# Patient Record
Sex: Female | Born: 1970 | Race: Black or African American | Hispanic: No | Marital: Married | Smoking: Former smoker
Health system: Southern US, Community
[De-identification: ages and names within clinical notes are randomized; demographics above are authoritative.]

## PROBLEM LIST (undated history)

## (undated) DIAGNOSIS — I10 Essential (primary) hypertension: Secondary | ICD-10-CM

## (undated) DIAGNOSIS — O24419 Gestational diabetes mellitus in pregnancy, unspecified control: Secondary | ICD-10-CM

## (undated) HISTORY — PX: NO PAST SURGERIES: SHX2092

---

## 2005-01-17 ENCOUNTER — Inpatient Hospital Stay (HOSPITAL_COMMUNITY): Admission: AD | Admit: 2005-01-17 | Discharge: 2005-01-17 | Payer: Self-pay | Admitting: Obstetrics and Gynecology

## 2005-01-17 ENCOUNTER — Ambulatory Visit: Payer: Self-pay | Admitting: Obstetrics and Gynecology

## 2005-01-19 ENCOUNTER — Ambulatory Visit: Payer: Self-pay | Admitting: *Deleted

## 2005-01-19 ENCOUNTER — Ambulatory Visit (HOSPITAL_COMMUNITY): Admission: RE | Admit: 2005-01-19 | Discharge: 2005-01-19 | Payer: Self-pay | Admitting: Obstetrics and Gynecology

## 2005-02-02 ENCOUNTER — Ambulatory Visit: Payer: Self-pay | Admitting: *Deleted

## 2005-02-08 ENCOUNTER — Inpatient Hospital Stay (HOSPITAL_COMMUNITY): Admission: AD | Admit: 2005-02-08 | Discharge: 2005-02-09 | Payer: Self-pay | Admitting: *Deleted

## 2005-02-11 ENCOUNTER — Ambulatory Visit: Payer: Self-pay | Admitting: Family Medicine

## 2005-02-11 ENCOUNTER — Inpatient Hospital Stay (HOSPITAL_COMMUNITY): Admission: AD | Admit: 2005-02-11 | Discharge: 2005-02-17 | Payer: Self-pay | Admitting: *Deleted

## 2005-06-04 ENCOUNTER — Emergency Department (HOSPITAL_COMMUNITY): Admission: EM | Admit: 2005-06-04 | Discharge: 2005-06-04 | Payer: Self-pay | Admitting: Emergency Medicine

## 2005-08-29 ENCOUNTER — Emergency Department (HOSPITAL_COMMUNITY): Admission: EM | Admit: 2005-08-29 | Discharge: 2005-08-29 | Payer: Self-pay | Admitting: Emergency Medicine

## 2007-01-18 IMAGING — US US OB FOLLOW-UP
1 series · 13 of 28 positions shown · non-contrast
Comparison: none

CLINICAL DATA: 33 weeks pregnant.  Evaluate AFI, cervical length and presentation.

[Series 1: us ob follow-up · 0.28mm/px · 13 of 31 slices shown]
[im 2/31]
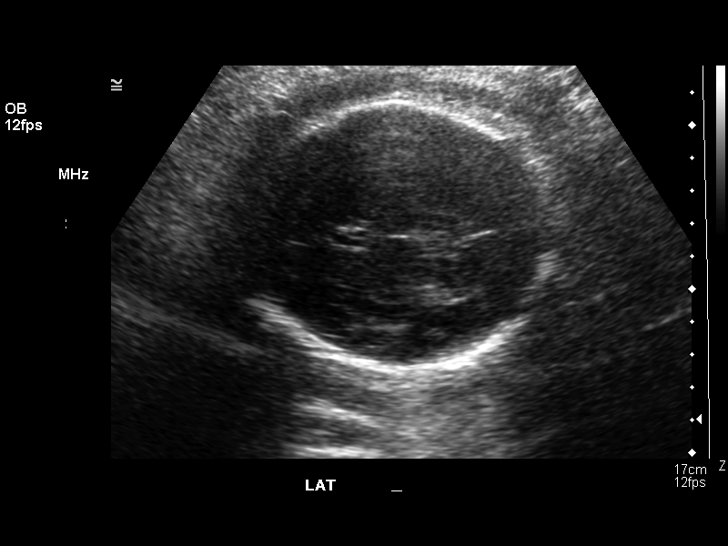
[im 4/31]
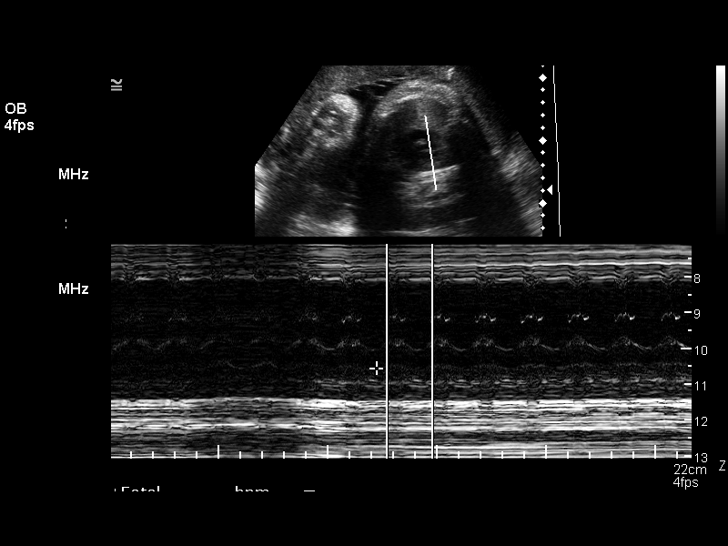
[im 6/31]
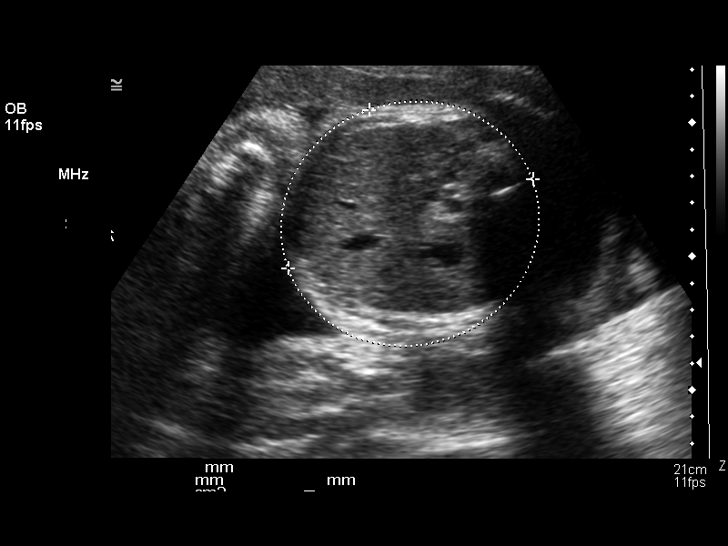
[im 8/31]
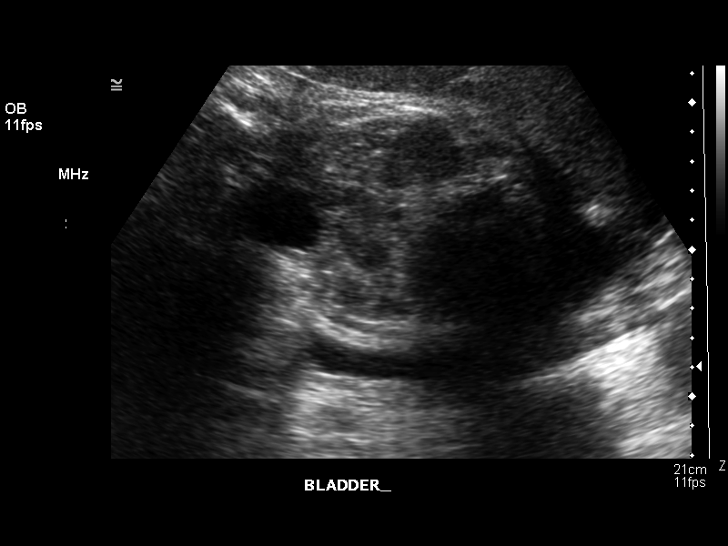
[im 11/31]
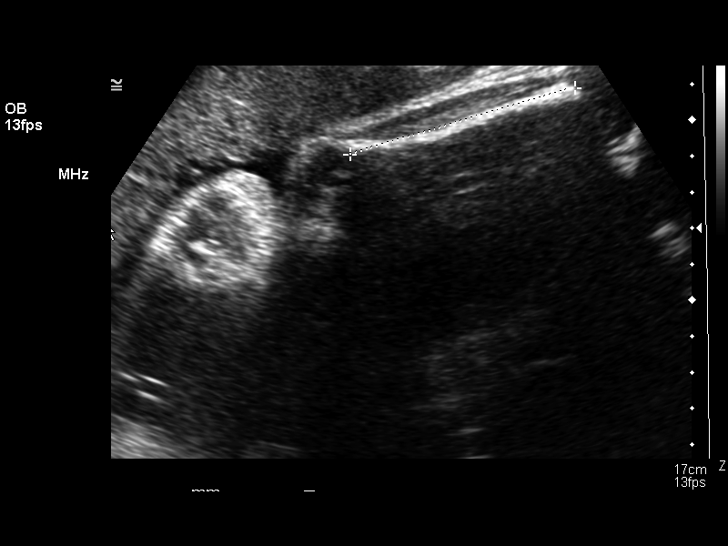
[im 13/31]
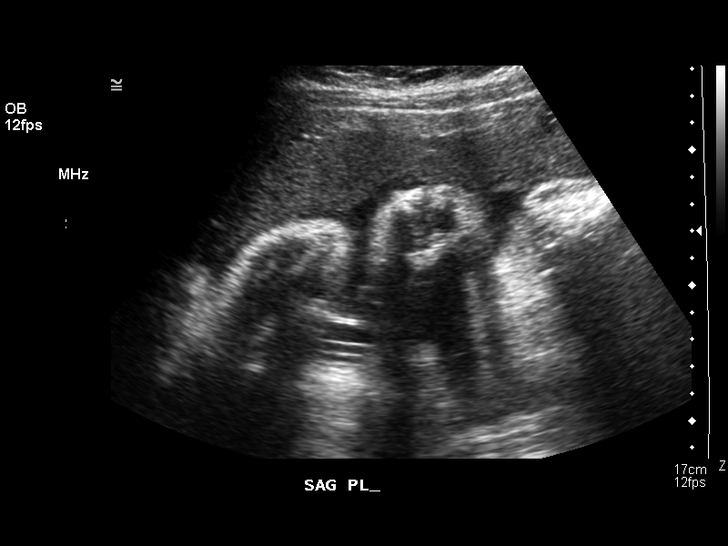
[im 16/31]
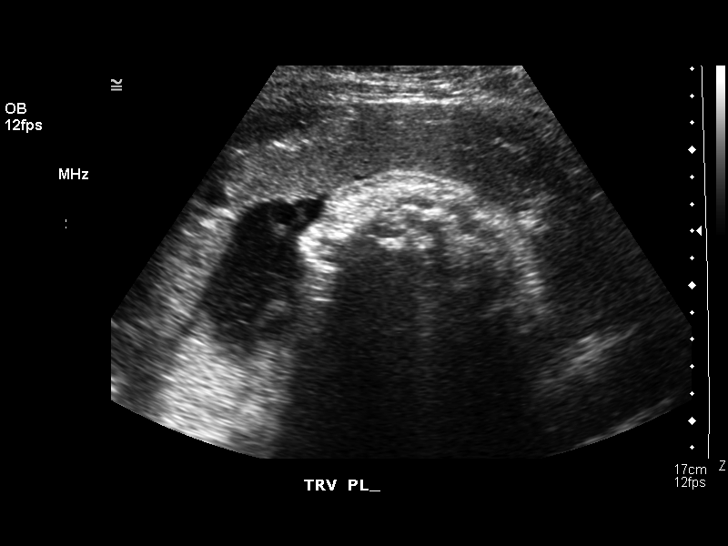
[im 18/31]
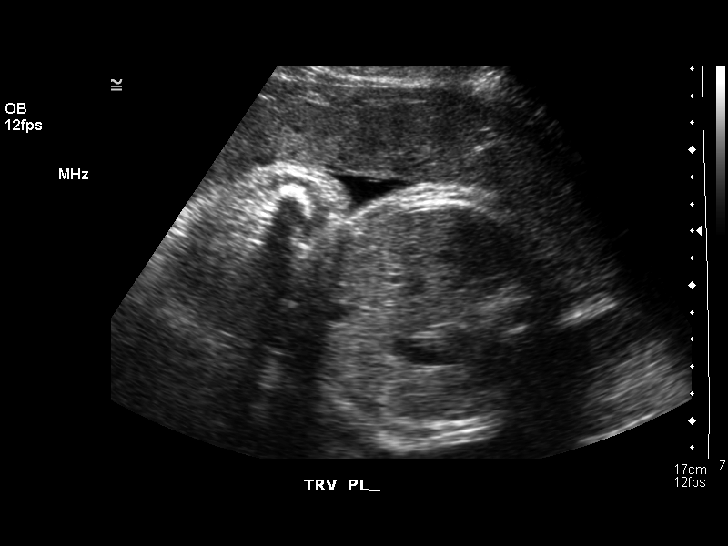
[im 21/31]
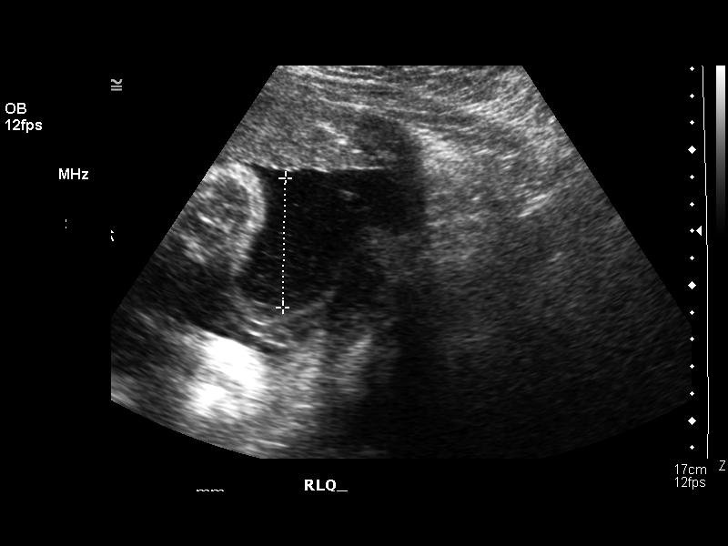
[im 23/31]
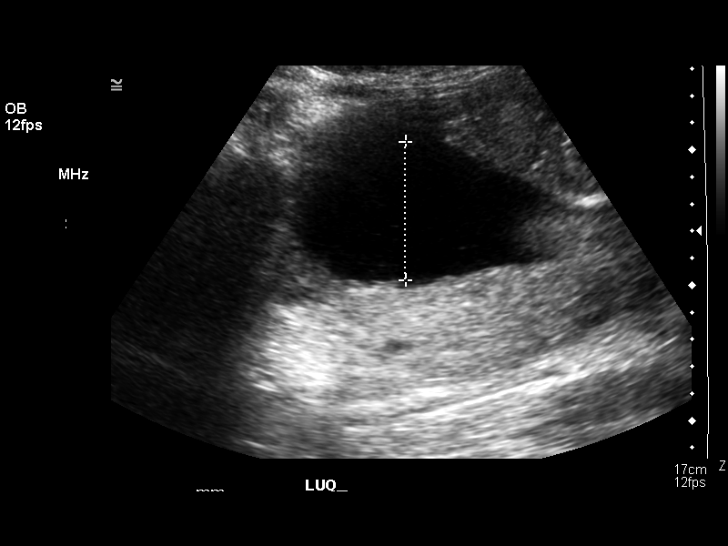
[im 25/31]
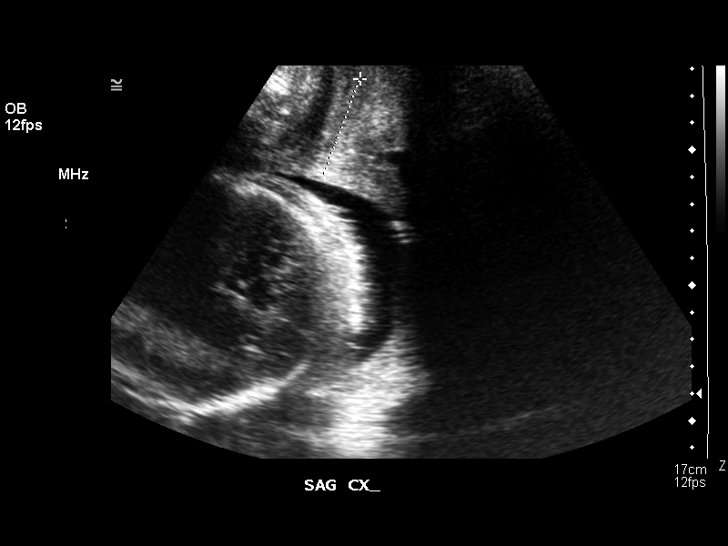
[im 27/31]
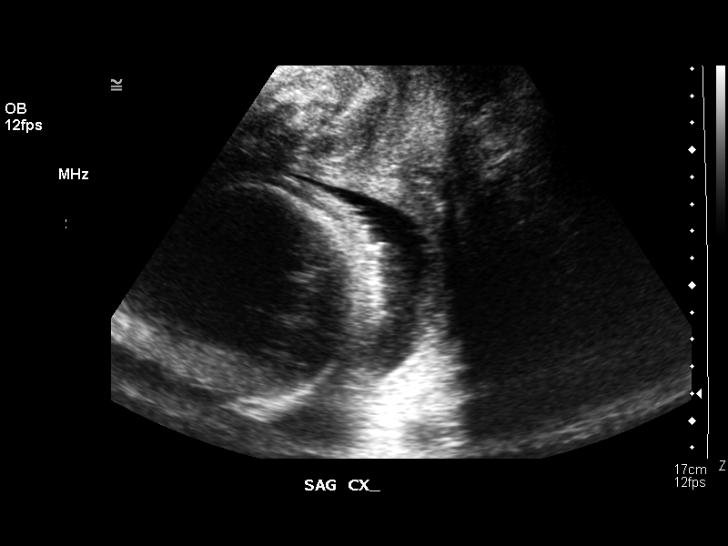
[im 29/31]
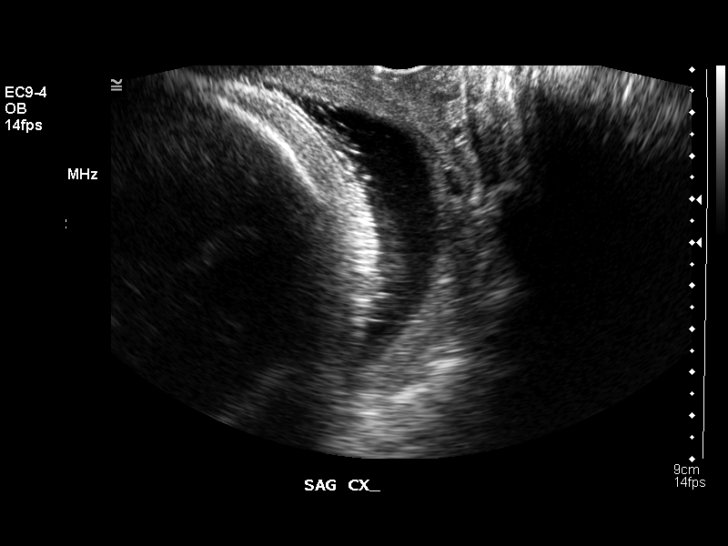

[13 of 28 positions shown; findings below may reference images not displayed]

OBSTETRICAL ULTRASOUND RE-EVALUATION WITH TRANSVAGINAL:
Number of Fetuses:  1
Heart Rate:   141
Movement: Yes
Breathing:  No
Presentation:  Cephalic
Placental Location:  Anterior
Grade:  II
Previa:  No
Amniotic Fluid (subjective):  Normal
Amniotic Fluid (objective):  19.8 cm AFI (5th -95th%ile = 8.1 ? 24.8 cm for 34 wks)

FETAL BIOMETRY
BPD:  8.1 cm   32 w 3 d
HC:  29.8 cm  33 w 0 d
AC:   29.6 cm   33 w 4 d
FL:  6.5 cm   33 w 4 d

Mean GA:  33 w 1 d
Assigned GA:  33 w 6 d

EFW:  7900 g (H) 50th ? 75th%ile (6104 ? 2058 g) For 34 wks

FETAL ANATOMY
Lateral Ventricles:  Visualized 
Thalami/CSP:  Previously seen 
Posterior Fossa:  Not visualized 
Nuchal Region:  N/A
Spine:  Previously seen 
4 Chamber Heart on Left:  Previously seen 
Stomach on Left:  Visualized 
3 Vessel Cord:  Previously seen 
Cord Insertion Site:  Previously seen 
Kidneys:  Visualized 
Bladder:  Visualized 
Extremities:  Previously limited

Evaluation limited by:  Advanced gestational age 

MATERNAL UTERINE AND ADNEXAL FINDINGS
Cervix:  0.6 cm Transvaginally
IMPRESSION: 1.  Single intrauterine pregnancy.  Fetal heart rate of 144 bpm.
2.  Cephalic presentation.  Amniotic fluid is subjectively and objectively normal.  
3.  Current mean gestational age 33 weeks 1 day.  Assigned gestational age 33 weeks 6 days.  
4.  Cervix shortened at 0.6 cm transvaginally.

## 2010-06-06 ENCOUNTER — Encounter: Payer: Self-pay | Admitting: *Deleted

## 2013-05-26 ENCOUNTER — Encounter (HOSPITAL_COMMUNITY): Payer: Self-pay | Admitting: Emergency Medicine

## 2013-05-26 ENCOUNTER — Emergency Department (HOSPITAL_COMMUNITY)
Admission: EM | Admit: 2013-05-26 | Discharge: 2013-05-26 | Disposition: A | Discharge status: Home / Self Care | Payer: Self-pay | Attending: Emergency Medicine | Admitting: Emergency Medicine

## 2013-05-26 DIAGNOSIS — M79605 Pain in left leg: Secondary | ICD-10-CM

## 2013-05-26 DIAGNOSIS — M549 Dorsalgia, unspecified: Secondary | ICD-10-CM | POA: Insufficient documentation

## 2013-05-26 DIAGNOSIS — M25559 Pain in unspecified hip: Secondary | ICD-10-CM | POA: Insufficient documentation

## 2013-05-26 DIAGNOSIS — M25569 Pain in unspecified knee: Secondary | ICD-10-CM | POA: Insufficient documentation

## 2013-05-26 MED ORDER — METHOCARBAMOL 500 MG PO TABS: 1000.0000 mg | ORAL_TABLET | Freq: Four times a day (QID) | ORAL | Status: DC

## 2013-05-26 MED ORDER — TRAMADOL HCL 50 MG PO TABS: 50.0000 mg | ORAL_TABLET | Freq: Four times a day (QID) | ORAL | Status: DC | PRN

## 2013-05-26 NOTE — Discharge Instructions (Signed)
Please read and follow all provided instructions.  Your diagnoses today include:  1. Leg pain, left     Tests performed today include:  Vital signs - see below for your results today  Medications prescribed:   Tramadol - narcotic-like pain medication  DO NOT drive or perform any activities that require you to be awake and alert because this medicine can make you drowsy.    Robaxin (methocarbamol) - muscle relaxer medication  DO NOT drive or perform any activities that require you to be awake and alert because this medicine can make you drowsy.   Take any prescribed medications only as directed.  Home care instructions:   Follow any educational materials contained in this packet  Please rest, use ice or heat on your back for the next several days  Do not lift, push, pull anything more than 10 pounds for the next week  Follow-up instructions: Please follow-up with your primary care provider in the next 1 week for further evaluation of your symptoms. If you do not have a primary care doctor -- see below for referral information.   Return instructions:  SEEK IMMEDIATE MEDICAL ATTENTION IF YOU HAVE:  New numbness, tingling, weakness, or problem with the use of your arms or legs  Severe back pain not relieved with medications  Loss control of your bowels or bladder  Increasing pain in any areas of the body (such as chest or abdominal pain)  Shortness of breath, dizziness, or fainting.   Worsening nausea (feeling sick to your stomach), vomiting, fever, or sweats  Any other emergent concerns regarding your health   Additional Information:  Your vital signs today were: BP 150/93   Pulse 97   Temp(Src) 97.9 F (36.6 C) (Oral)   Resp 14   Ht 5' 7.5" (1.715 m)   Wt 215 lb (97.523 kg)   BMI 33.16 kg/m2   SpO2 98%   LMP 05/11/2013 If your blood pressure (BP) was elevated above 135/85 this visit, please have this repeated by your doctor within one  month. --------------  Emergency Department Resource Guide 1) Find a Doctor and Pay Out of Pocket Although you won't have to find out who is covered by your insurance plan, it is a good idea to ask around and get recommendations. You will then need to call the office and see if the doctor you have chosen will accept you as a new patient and what types of options they offer for patients who are self-pay. Some doctors offer discounts or will set up payment plans for their patients who do not have insurance, but you will need to ask so you aren't surprised when you get to your appointment.  2) Contact Your Local Health Department Not all health departments have doctors that can see patients for sick visits, but many do, so it is worth a call to see if yours does. If you don't know where your local health department is, you can check in your phone book. The CDC also has a tool to help you locate your state's health department, and many state websites also have listings of all of their local health departments.  3) Find a Walk-in Clinic If your illness is not likely to be very severe or complicated, you may want to try a walk in clinic. These are popping up all over the country in pharmacies, drugstores, and shopping centers. They're usually staffed by nurse practitioners or physician assistants that have been trained to treat common illnesses and complaints.  They're usually fairly quick and inexpensive. However, if you have serious medical issues or chronic medical problems, these are probably not your best option.  No Primary Care Doctor: - Call Health Connect at  [phone] - they can help you locate a primary care doctor that  accepts your insurance, provides certain services, etc. - Physician Referral Service- [phone]  Chronic Pain Problems: Organization         Address  Phone   Notes  [facility]  [phone] Patients need to be referred by their primary care doctor.    Medication Assistance: Organization         Address  Phone   Notes  [facility] [address] [place] [place], [place] [address] [phone] --Must be a resident of [facility] -- Must have NO insurance coverage whatsoever (no Medicaid/ Medicare, etc.) -- The pt. MUST have a primary care doctor that directs their care regularly and follows them in the community   [facility]  [phone]   [employer]  [phone]    Agencies that provide inexpensive medical care: Organization         Address  Phone   Notes  [facility]  [phone]   [facility]    [phone]   [facility] [address] [place], [place] [address] [phone]   [facility] [address], [place] [phone]   Planned Parenthood    [phone]   [facility]    [phone]   Community Health and [facility]  [address], [place] Phone:  [phone], Fax:  [phone] Hours of Operation:  9 am - 6 pm, M-F.  Also accepts Medicaid/Medicare and self-pay.  [facility]  [address], [place] Phone: [phone], Fax: [phone]. Hours of Operation:  8:30 am - 5:30 pm, M-F.  Also accepts Medicaid and self-pay.  [facility] [place] [address], [place] Phone: [phone]   [facility] [address] Natasha Bence [place], [place] [phone], Ext. [phone] Mondays & Thursdays: 7-9 AM.  First 15 patients are seen on a first come, first serve basis.    Medicaid-accepting [facility] Providers:  Organization         Address  Phone   Notes  [facility] [address], [place] [phone] Also accepts self-pay patients.  [facility] [address] Laurell Josephs [place], [place]  [phone]   [facility] [address], [place] [phone]   [facility] [address], [place] [phone]   Renaye Rakers [address], [place]   [phone] Only accepts [place] Access [place] patients after they have their name applied to their card.   Self-Pay (no insurance) in [facility]:  Organization         Address  Phone   Notes  Sickle Cell Patients, [facility] [address] [place], [place] [phone]   [facility] [address] [place], [place] [phone]   [facility]  [address], [place] [phone]   [facility]/Dr. Osei-Bonsu  [address] [place] or [address], [place] [phone] Phone number for both [facility] and [place] locations is the same.  [facility] and [facility] [address], [place] [phone]   [facility] [address] [place] or [address] [phone]   [facility] [address] [place] [phone], phone; [phone], fax Sees patients 1st and 3rd Saturday of every month.  Must not qualify for public or private insurance (i.e. Medicaid, Medicare, [place] Health Choice, Veterans' Benefits)  Household income should be no more than 200% of the poverty level The clinic cannot treat you if you are pregnant or think you are pregnant  Sexually transmitted diseases are not treated at the clinic.    Dental Care: Organization         Address  Phone  Notes  [facility] Department of [facility] [address] [place], [place] [phone] Accepts children up to age 85 who are enrolled in [place] or [place] Health Choice; pregnant women with a Medicaid card; and children who have applied for Medicaid or [place] Health Choice, but were declined, whose parents can pay a reduced fee at time of service.   [facility] Department of [facility]  [address], [place] [phone] Accepts children up to age 20 who are enrolled in [place] or [place] Health Choice; pregnant women with a Medicaid card; and children who have applied for Medicaid or [place] Health Choice, but were declined, whose parents can pay a reduced fee at time of service.  [facility]  [address] [place], [place] [phone] Patients are seen by appointment only. Walk-ins are not accepted. [facility] will see patients 87 years of age and older. Monday - Tuesday (8am-5pm) Most Wednesdays (8:30-5pm) $30 per visit, cash only  [facility]  [address], [facility] [phone] Patients are seen by appointment only. Walk-ins are not accepted. [facility] will see patients 5 years of age and older. One Wednesday Evening (Monthly: Volunteer Based).  $30 per visit, cash only  [employer]  [phone] for adults; Children under age 12, call Graduate Pediatric Dentistry at [phone]. Children aged 73-14, please call [phone] to request a pediatric application.  Dental services are provided in all areas of dental care including fillings, crowns and bridges, complete and partial dentures, implants, gum treatment, root canals, and extractions. Preventive care is also provided. Treatment is provided to both adults and children. Patients are selected via a lottery and there is often a waiting list.   [facility] [address] [place]  [phone] [email or web address]   [facility] [address] [place], [place] [phone], Ext. [phone] Second and Fourth Thursday of each month, opens at 6:30 AM; Clinic ends at 9 AM.  Patients are seen on a first-come first-served basis, and a limited number are seen during each clinic.   [facility]  [address] Ether Griffins [place], [place] [phone]   Eligibility Requirements You must have lived in [place], [place], or [place] counties for at least the last three months.   You cannot be eligible for state or federal sponsored [place], including [employer], [place], or [employer].   You generally cannot be eligible for healthcare insurance through your employer.    How to apply: Eligibility screenings are held every Tuesday and Wednesday afternoon from  1:00 pm until 4:00 pm. You do not need an appointment for the interview!  [facility] [address], [place], [place] [phone]   [facility]  [phone]   [facility] Health Department  [phone]   [facility] Health Department  [phone]    Behavioral Health Resources in the Community: Intensive Outpatient Programs Organization         Address  Phone  Notes  [facility] [address], [place], [place] [phone]   [facility] [address] [place], [place] [phone]   [facility] [address] [place], [place]  [phone]   [facility] [address]  [place], [place] [phone] or [phone]   Substance Abuse Resources Organization         Address  Phone  Notes  [facility]  [phone]   [facility]  [phone]   [facility]  [phone]   Floydene Flock  [phone]   Residential & Outpatient Substance Abuse Program  [phone]   Psychological Services Organization         Address  Phone  Notes  [facility]  [phone]   [facility] Services  [phone]   [facility] Mental Health [address], [place] [phone] or [phone]    Mobile Crisis Teams Organization         Address  Phone  Notes  [facility], Mobile Crisis Care Unit  [phone]   [facility]  [address] [place], [place] [phone]   Doristine Locks [address] [place] [phone]    Self-Help/Support Groups Organization         Address  Phone             Notes  [facility] [place] - variety of support groups  [phone]- [phone] Call for more information  Narcotics Anonymous (NA), [facility] [address], [employer] [place]  2 meetings at this location   Statistician         Address  Phone  Notes  [facility] [address],    [place]  [phone]   [facility]  [address], [place] [address], [place], [place] [phone]   [facility] [address] [place], [place] [phone] Admissions: 8am-3pm M-F  [facility] [address],    [place], [place] [phone]   [facility] [address] [place] [place], [place], [place] [phone]   [facility] [address]  [place], [place] [phone]   [facility] - Intensive Outpatient [address] Laurell Josephs [address], [place], [place] [phone]   [facility] ([facility] [address] [place]  [place], [place] [phone] or [phone]   [facility] ([facility]) [address] [place], [place] [phone] Accepts Medicaid  [address],  [place] [phone] Substance Abuse/Addiction Treatment   [facility] Organization         Address  Phone  Notes  [facility]  [phone]   Angie Fava, PhD [address] Ervin Knack [place], [place]   [phone] or [phone]   [facility] Behavioral   [address] [place], [place] [phone]   [facility] [address], [place], [place] [phone] Insurance/Medicaid/sponsorship through [employer] and Families [address] [place]  [address]                                    [place], [place] [phone]  Therapy/tele-psych/case  [facility] [address]   [place], [place] [phone]    Dr. Lolly Mustache  [phone]   [facility] [place]  [employer] [facility] Dept. 1) [address] [place] 2) [address], [place] 3)  [address], [place] [phone]  [phone]   [facility] Child Abuse Hotline [phone] or [phone] (After Hours)

## 2013-05-26 NOTE — ED Provider Notes (Signed)
CSN: [id number]     Arrival date & time 05/26/13  1921 History   First MD Initiated Contact with Patient 05/26/13 1937     This chart was scribed for non-physician practitioner, Rhea Bleacher PA-C working with Toy Baker, MD by Arlan Organ, ED Scribe. This patient was seen in room TR05C/TR05C and the patient's care was started at 7:55 PM.   Chief Complaint  Patient presents with  . Leg Pain   The history is provided by the patient. No language interpreter was used.    HPI Comments: Ashley Randall is a 43 y.o. female who presents to the Emergency Department complaining of reoccurring, ongoing, shooting, intermittent moderate left hip pain that radiates down the left leg brought on by walking that initially started several days ago. She reports weakness to her leg when her pain is brought on. She reports this incident occuring 1 time today. Pt states previously lived in [place] and was seen for the same complaint. She states an MRI was performed and she was given pain medication. Pt is unable to describe the pain, but says it is "unbearable" when it comes. She states standing still and taking a break from ambulating alleviates her pain. She has tried OTC Motrin with no improvement. Pt denies any long distance travel. She denies any swelling or rash.  History reviewed. No pertinent past medical history. History reviewed. No pertinent past surgical history. No family history on file. History  Substance Use Topics  . Smoking status: Current Every Day Smoker  . Smokeless tobacco: Not on file  . Alcohol Use: No   OB History   Grav Para Term Preterm Abortions TAB SAB Ect Mult Living                 Review of Systems  Constitutional: Negative for fever, chills and unexpected weight change.  Gastrointestinal: Negative for constipation.       Negative for fecal incontinence.   Genitourinary: Negative for dysuria, hematuria, flank pain, vaginal bleeding, vaginal discharge and pelvic pain.   Negative for urinary incontinence or retention.  Musculoskeletal: Positive for arthralgias (left hip pain), back pain and myalgias (left leg pain).  Skin: Negative for rash.  Neurological: Negative for weakness and numbness.       Denies saddle paresthesias.    Allergies  Review of patient's allergies indicates no known allergies.  Home Medications   Current Outpatient Rx  Name  Route  Sig  Dispense  Refill  . acetaminophen (TYLENOL) 500 MG tablet   Oral   Take 1,000 mg by mouth every 6 (six) hours as needed for headache.         . ibuprofen (ADVIL,MOTRIN) 200 MG tablet   Oral   Take 400 mg by mouth every 6 (six) hours as needed for mild pain.         . Multiple Vitamin (MULTIVITAMIN WITH MINERALS) TABS tablet   Oral   Take 1 tablet by mouth daily.          Triage Vitals: BP 150/93  Pulse 97  Temp(Src) 97.9 F (36.6 C) (Oral)  Resp 14  Ht 5' 7.5" (1.715 m)  Wt 215 lb (97.523 kg)  BMI 33.16 kg/m2  SpO2 98%  LMP 05/11/2013  Physical Exam  Nursing note and vitals reviewed. Constitutional: She appears well-developed and well-nourished.  HENT:  Head: Normocephalic and atraumatic.  Eyes: Conjunctivae and EOM are normal.  Neck: Normal range of motion. Neck supple.  Cardiovascular: Normal rate.  Pulmonary/Chest: Effort normal.  Abdominal: Soft. There is no tenderness. There is no CVA tenderness.  Musculoskeletal: Normal range of motion. She exhibits no tenderness.       Left hip: Normal. She exhibits normal range of motion, normal strength and no tenderness.       Left knee: Normal.       Lumbar back: Normal. She exhibits normal range of motion, no tenderness and no bony tenderness.       Left upper leg: Normal. She exhibits no tenderness, no bony tenderness and no swelling.  No step-off noted with palpation of spine.   Neurological: She is alert. She has normal strength and normal reflexes. No sensory deficit.  5/5 strength in entire lower extremities  bilaterally. No sensation deficit.   Skin: Skin is warm and dry. No rash noted.  Psychiatric: She has a normal mood and affect. Her behavior is normal.    ED Course  Procedures (including critical care time)  DIAGNOSTIC STUDIES: Oxygen Saturation is 98% on RA, Normal by my interpretation.    COORDINATION OF CARE: 7:58 PM-D Will give tramadol and robaxin.iscussed treatment plan with pt at bedside and pt agreed to plan.     Labs Review Labs Reviewed - No data to display Imaging Review No results found.  EKG Interpretation   None      Patient seen and examined.    Vital signs reviewed and are as follows: Filed Vitals:   05/26/13 2003  BP: 136/89  Pulse: 87  Temp: 97.8 F (36.6 C)  Resp: 16   Patient currently in no pain, exam is normal.   Patient was counseled on back pain precautions and told to do activity as tolerated but do not lift, push, or pull heavy objects more than 10 pounds for the next week.    Patient prescribed muscle relaxer and counseled on proper use of muscle relaxant medication.    Patient prescribed narcotic pain medicine and counseled on proper use of narcotic pain medications. Counseled not to combine this medication with others containing tylenol.   Urged patient not to drink alcohol, drive, or perform any other activities that requires focus while taking either of these medications.  Patient urged to follow-up with PCP if pain does not improve with treatment and rest or if pain becomes recurrent. Urged to return with worsening severe pain, loss of bowel or bladder control, trouble walking.   The patient verbalizes understanding and agrees with the plan.  MDM   1. Leg pain, left    Patient with short-lived shooting pain from hip into upper leg lasting several minutes and then resolves. Similar as in past. No swelling or injury. No weakness or trouble walking when pain is not present. No red flag s/s of low back pain. Will treat with meds,  encouraged PCP f/u. Referrals given.   I personally performed the services described in this documentation, which was scribed in my presence. The recorded information has been reviewed and is accurate.    Renne CriglerJoshua Elia Nunley, PA-C 05/26/13 2216

## 2013-05-26 NOTE — ED Notes (Signed)
Pt. reports intermittent  left leg pain for several days , denies injury or fall , ambulatory .

## 2013-05-29 NOTE — ED Provider Notes (Signed)
Medical screening examination/treatment/procedure(s) were performed by non-physician practitioner and as supervising physician I was immediately available for consultation/collaboration.  Ashtin Melichar T Savilla Turbyfill, MD 05/29/13 1939 

## 2013-08-25 ENCOUNTER — Encounter (HOSPITAL_COMMUNITY): Payer: Self-pay | Admitting: *Deleted

## 2013-08-25 ENCOUNTER — Inpatient Hospital Stay (HOSPITAL_COMMUNITY)
Admission: AD | Admit: 2013-08-25 | Discharge: 2013-08-25 | Disposition: A | Discharge status: Home / Self Care | Payer: Medicaid Other | Source: Ambulatory Visit | Attending: Obstetrics & Gynecology | Admitting: Obstetrics & Gynecology

## 2013-08-25 ENCOUNTER — Inpatient Hospital Stay (HOSPITAL_COMMUNITY): Payer: Medicaid Other

## 2013-08-25 DIAGNOSIS — Z87891 Personal history of nicotine dependence: Secondary | ICD-10-CM | POA: Insufficient documentation

## 2013-08-25 DIAGNOSIS — A599 Trichomoniasis, unspecified: Secondary | ICD-10-CM

## 2013-08-25 DIAGNOSIS — R030 Elevated blood-pressure reading, without diagnosis of hypertension: Secondary | ICD-10-CM | POA: Insufficient documentation

## 2013-08-25 DIAGNOSIS — A5901 Trichomonal vulvovaginitis: Secondary | ICD-10-CM | POA: Insufficient documentation

## 2013-08-25 DIAGNOSIS — O98819 Other maternal infectious and parasitic diseases complicating pregnancy, unspecified trimester: Secondary | ICD-10-CM | POA: Insufficient documentation

## 2013-08-25 DIAGNOSIS — O209 Hemorrhage in early pregnancy, unspecified: Secondary | ICD-10-CM | POA: Insufficient documentation

## 2013-08-25 DIAGNOSIS — IMO0001 Reserved for inherently not codable concepts without codable children: Secondary | ICD-10-CM

## 2013-08-25 HISTORY — DX: Gestational diabetes mellitus in pregnancy, unspecified control: O24.419

## 2013-08-25 HISTORY — DX: Essential (primary) hypertension: I10

## 2013-08-25 LAB — URINALYSIS, ROUTINE W REFLEX MICROSCOPIC
Bilirubin Urine: NEGATIVE
Glucose, UA: NEGATIVE mg/dL
KETONES UR: NEGATIVE mg/dL
LEUKOCYTES UA: NEGATIVE
Nitrite: NEGATIVE
Protein, ur: NEGATIVE mg/dL
Specific Gravity, Urine: 1.02 (ref 1.005–1.030)
Urobilinogen, UA: 1 mg/dL (ref 0.0–1.0)
pH: 6 (ref 5.0–8.0)

## 2013-08-25 LAB — WET PREP, GENITAL
Clue Cells Wet Prep HPF POC: NONE SEEN
TRICH WET PREP: NONE SEEN
YEAST WET PREP: NONE SEEN

## 2013-08-25 LAB — CBC
HCT: 38.1 % (ref 36.0–46.0)
Hemoglobin: 13.5 g/dL (ref 12.0–15.0)
MCH: 30.8 pg (ref 26.0–34.0)
MCHC: 35.4 g/dL (ref 30.0–36.0)
MCV: 86.8 fL (ref 78.0–100.0)
PLATELETS: 275 10*3/uL (ref 150–400)
RBC: 4.39 MIL/uL (ref 3.87–5.11)
RDW: 12.4 % (ref 11.5–15.5)
WBC: 6.8 10*3/uL (ref 4.0–10.5)

## 2013-08-25 LAB — HCG, QUANTITATIVE, PREGNANCY: hCG, Beta Chain, Quant, S: 72 m[IU]/mL — ABNORMAL HIGH (ref <5)

## 2013-08-25 LAB — URINE MICROSCOPIC-ADD ON

## 2013-08-25 MED ORDER — METRONIDAZOLE 500 MG PO TABS: 500.0000 mg | ORAL_TABLET | Freq: Two times a day (BID) | ORAL | Status: AC

## 2013-08-25 NOTE — MAU Note (Signed)
Pt presents with complaints of bright red vaginal bleeding that started this afternoon and she had a + HPT last Monday.

## 2013-08-25 NOTE — [facility] Provider Note (Signed)
Chief Complaint  Patient presents with  . Possible Pregnancy  . Vaginal Bleeding    Subjective Ashley Randall 43 y.o.  [id number] at 6w by LMP presents with onset today of first episode of moderate amount red vaginal bleeding; no clots or tissue. Saturated 2 peripads. Denies abdominal pain. Positive HPT 1 wk ago. No contraception. Last intercourse 2 wks ago.  Denies irritative vaginal discharge. No dysuria or hematuria. Denies illicit drug use.  Blood type: unknown  Pregnancy course: NPC.   Pertinent Medical History: BPs elevated in past without dx HTN Pertinent Ob/Gyn History: NSVD 8 yrs ago; EAB Pertinent Surgical History: none Pertinent Social History: Quit smoking in Feb 2015.   Prescriptions prior to admission  Medication Sig Dispense Refill  . acetaminophen (TYLENOL) 500 MG tablet Take 1,000 mg by mouth every 6 (six) hours as needed for headache.      . ibuprofen (ADVIL,MOTRIN) 200 MG tablet Take 400 mg by mouth every 6 (six) hours as needed for mild pain.      . methocarbamol (ROBAXIN) 500 MG tablet Take 2 tablets (1,000 mg total) by mouth 4 (four) times daily.  20 tablet  0  . Multiple Vitamin (MULTIVITAMIN WITH MINERALS) TABS tablet Take 1 tablet by mouth daily.      . traMADol (ULTRAM) 50 MG tablet Take 1 tablet (50 mg total) by mouth every 6 (six) hours as needed.  15 tablet  0    No Known Allergies   Objective   Filed Vitals:   08/25/13 1846  BP: 164/97  Pulse: 87  Temp:   Resp: 18    Filed Vitals:   08/25/13 1844 08/25/13 1846 08/25/13 2024  BP:  164/97 149/86  Pulse:  87   Temp: 98.5 F (36.9 C)    TempSrc: Oral    Resp:  18   Height: 5\' 7"  (1.702 m)    Weight: 97.977 kg (216 lb)      Physical Exam General: WN/WD in NAD  Abdom: soft, NT External genitalia: normal; BUS neg  SSE: small amount bright red blood; cervix with no lesions, appears closed without active bleeding Bimanual: Cervix closed, long; uterus anteverted, NT, 4-6 weeks size; adnexa  nontender, no masses   Lab Results Results for orders placed during the hospital encounter of 08/25/13 (from the past 24 hour(s))  URINALYSIS, ROUTINE W REFLEX MICROSCOPIC     Status: Abnormal   Collection Time    08/25/13  6:50 PM      Result Value Ref Range   Color, Urine YELLOW  YELLOW   APPearance HAZY (*) CLEAR   Specific Gravity, Urine 1.020  1.005 - 1.030   pH 6.0  5.0 - 8.0   Glucose, UA NEGATIVE  NEGATIVE mg/dL   Hgb urine dipstick LARGE (*) NEGATIVE   Bilirubin Urine NEGATIVE  NEGATIVE   Ketones, ur NEGATIVE  NEGATIVE mg/dL   Protein, ur NEGATIVE  NEGATIVE mg/dL   Urobilinogen, UA 1.0  0.0 - 1.0 mg/dL   Nitrite NEGATIVE  NEGATIVE   Leukocytes, UA NEGATIVE  NEGATIVE  URINE MICROSCOPIC-ADD ON     Status: Abnormal   Collection Time    08/25/13  6:50 PM      Result Value Ref Range   Squamous Epithelial / LPF FEW (*) RARE   WBC, UA 0-2  <3 WBC/hpf   RBC / HPF TOO NUMEROUS TO COUNT  <3 RBC/hpf   Urine-Other TRICHOMONAS PRESENT    WET PREP, GENITAL     Status:  Abnormal   Collection Time    08/25/13  7:10 PM      Result Value Ref Range   Yeast Wet Prep HPF POC NONE SEEN  NONE SEEN   Trich, Wet Prep NONE SEEN  NONE SEEN   Clue Cells Wet Prep HPF POC NONE SEEN  NONE SEEN   WBC, Wet Prep HPF POC FEW (*) NONE SEEN  CBC     Status: None   Collection Time    08/25/13  7:25 PM      Result Value Ref Range   WBC 6.8  4.0 - 10.5 K/uL   RBC 4.39  3.87 - 5.11 MIL/uL   Hemoglobin 13.5  12.0 - 15.0 g/dL   HCT 91.4  78.2 - 95.6 %   MCV 86.8  78.0 - 100.0 fL   MCH 30.8  26.0 - 34.0 pg   MCHC 35.4  30.0 - 36.0 g/dL   RDW 21.3  08.6 - 57.8 %   Platelets 275  150 - 400 K/uL    Ultrasound  Significant only for endometrial thickening 1.4 cm, heterogenous, right CLC  Assessment 1. Bleeding in early pregnancy   2. Elevated blood pressure   3. Trichimoniasis   Pregnancy viability undetermined   Plan    GC/CT sent Discharge home with ectopic precautions and F/U quant in 48  hours See AVS for pt education   Medication List         metroNIDAZOLE 500 MG tablet  Commonly known as:  FLAGYL  Take 1 tablet (500 mg total) by mouth 2 (two) times daily.     multivitamin with minerals Tabs tablet  Take 1 tablet by mouth daily.            Zorana Brockwell C Kristeena Meineke 08/25/2013 7:10 PM

## 2013-08-25 NOTE — Discharge Instructions (Signed)
Hypertension As your heart beats, it forces blood through your arteries. This force is your blood pressure. If the pressure is too high, it is called hypertension (HTN) or high blood pressure. HTN is dangerous because you may have it and not know it. High blood pressure may mean that your heart has to work harder to pump blood. Your arteries may be narrow or stiff. The extra work puts you at risk for heart disease, stroke, and other problems.  Blood pressure consists of two numbers, a higher number over a lower, 110/72, for example. It is stated as "110 over 72." The ideal is below 120 for the top number (systolic) and under 80 for the bottom (diastolic). Write down your blood pressure today. You should pay close attention to your blood pressure if you have certain conditions such as:  Heart failure.  Prior heart attack.  Diabetes  Chronic kidney disease.  Prior stroke.  Multiple risk factors for heart disease. To see if you have HTN, your blood pressure should be measured while you are seated with your arm held at the level of the heart. It should be measured at least twice. A one-time elevated blood pressure reading (especially in the Emergency Department) does not mean that you need treatment. There may be conditions in which the blood pressure is different between your right and left arms. It is important to see your caregiver soon for a recheck. Most people have essential hypertension which means that there is not a specific cause. This type of high blood pressure may be lowered by changing lifestyle factors such as:  Stress.  Smoking.  Lack of exercise.  Excessive weight.  Drug/tobacco/alcohol use.  Eating less salt. Most people do not have symptoms from high blood pressure until it has caused damage to the body. Effective treatment can often prevent, delay or reduce that damage. TREATMENT  When a cause has been identified, treatment for high blood pressure is directed at the  cause. There are a large number of medications to treat HTN. These fall into several categories, and your caregiver will help you select the medicines that are best for you. Medications may have side effects. You should review side effects with your caregiver. If your blood pressure stays high after you have made lifestyle changes or started on medicines,   Your medication(s) may need to be changed.  Other problems may need to be addressed.  Be certain you understand your prescriptions, and know how and when to take your medicine.  Be sure to follow up with your caregiver within the time frame advised (usually within two weeks) to have your blood pressure rechecked and to review your medications.  If you are taking more than one medicine to lower your blood pressure, make sure you know how and at what times they should be taken. Taking two medicines at the same time can result in blood pressure that is too low. SEEK IMMEDIATE MEDICAL CARE IF:  You develop a severe headache, blurred or changing vision, or confusion.  You have unusual weakness or numbness, or a faint feeling.  You have severe chest or abdominal pain, vomiting, or breathing problems. MAKE SURE YOU:   Understand these instructions.  Will watch your condition.  Will get help right away if you are not doing well or get worse. Document Released: 05/02/2005 Document Revised: 07/25/2011 Document Reviewed: 12/21/2007 [facility] Patient Information 2014 [place], [place]. Vaginal Bleeding During Pregnancy, First Trimester A small amount of bleeding (spotting) from the vagina is  relatively common in early pregnancy. It usually stops on its own. Various things may cause bleeding or spotting in early pregnancy. Some bleeding may be related to the pregnancy, and some may not. In most cases, the bleeding is normal and is not a problem. However, bleeding can also be a sign of something serious. Be sure to tell your health care provider about  any vaginal bleeding right away. Some possible causes of vaginal bleeding during the first trimester include:  Infection or inflammation of the cervix.  Growths (polyps) on the cervix.  Miscarriage or threatened miscarriage.  Pregnancy tissue has developed outside of the uterus and in a fallopian tube (tubal pregnancy).  Tiny cysts have developed in the uterus instead of pregnancy tissue (molar pregnancy). HOME CARE INSTRUCTIONS  Watch your condition for any changes. The following actions may help to lessen any discomfort you are feeling:  Follow your health care provider's instructions for limiting your activity. If your health care provider orders bed rest, you may need to stay in bed and only get up to use the bathroom. However, your health care provider may allow you to continue light activity.  If needed, make plans for someone to help with your regular activities and responsibilities while you are on bed rest.  Keep track of the number of pads you use each day, how often you change pads, and how soaked (saturated) they are. Write this down.  Do not use tampons. Do not douche.  Do not have sexual intercourse or orgasms until approved by your health care provider.  If you pass any tissue from your vagina, save the tissue so you can show it to your health care provider.  Only take over-the-counter or prescription medicines as directed by your health care provider.  Do not take aspirin because it can make you bleed.  Keep all follow-up appointments as directed by your health care provider. SEEK MEDICAL CARE IF:  You have any vaginal bleeding during any part of your pregnancy.  You have cramps or labor pains. SEEK IMMEDIATE MEDICAL CARE IF:   You have severe cramps in your back or belly (abdomen).  You have a fever, not controlled by medicine.  You pass large clots or tissue from your vagina.  Your bleeding increases.  You feel lightheaded or weak, or you have fainting  episodes.  You have chills.  You are leaking fluid or have a gush of fluid from your vagina.  You pass out while having a bowel movement. MAKE SURE YOU:  Understand these instructions.  Will watch your condition.  Will get help right away if you are not doing well or get worse. Document Released: 02/09/2005 Document Revised: 02/20/2013 Document Reviewed: 01/07/2013 [facility] Patient Information 2014 [place]. Trichomoniasis Trichomoniasis is an infection, caused by the Trichomonas organism, that affects both women and men. In women, the outer female genitalia and the vagina are affected. In men, the penis is mainly affected, but the prostate and other reproductive organs can also be involved. Trichomoniasis is a sexually transmitted disease (STD) and is most often passed to another person through sexual contact. The majority of people who get trichomoniasis do so from a sexual encounter and are also at risk for other STDs. CAUSES   Sexual intercourse with an infected partner.  It can be present in swimming pools or hot tubs. SYMPTOMS   Abnormal gray-green frothy vaginal discharge in women.  Vaginal itching and irritation in women.  Itching and irritation of the area outside the  vagina in women.  Penile discharge with or without pain in males.  Inflammation of the urethra (urethritis), causing painful urination.  Bleeding after sexual intercourse. RELATED COMPLICATIONS  Pelvic inflammatory disease.  Infection of the uterus (endometritis).  Infertility.  Tubal (ectopic) pregnancy.  It can be associated with other STDs, including gonorrhea and chlamydia, hepatitis B, and HIV. COMPLICATIONS DURING PREGNANCY  Early (premature) delivery.  Premature rupture of the membranes (PROM).  Low birth weight. DIAGNOSIS   Visualization of Trichomonas under the microscope from the vagina discharge.  Ph of the vagina greater than 4.5, tested with a test tape.  Trich  Rapid Test.  Culture of the organism, but this is not usually needed.  It may be found on a Pap test.  Having a "strawberry cervix,"which means the cervix looks very red like a strawberry. TREATMENT   You may be given medication to fight the infection. Inform your caregiver if you could be or are pregnant. Some medications used to treat the infection should not be taken during pregnancy.  Over-the-counter medications or creams to decrease itching or irritation may be recommended.  Your sexual partner will need to be treated if infected. HOME CARE INSTRUCTIONS   Take all medication prescribed by your caregiver.  Take over-the-counter medication for itching or irritation as directed by your caregiver.  Do not have sexual intercourse while you have the infection.  Do not douche or wear tampons.  Discuss your infection with your partner, as your partner may have acquired the infection from you. Or, your partner may have been the person who transmitted the infection to you.  Have your sex partner examined and treated if necessary.  Practice safe, informed, and protected sex.  See your caregiver for other STD testing. SEEK MEDICAL CARE IF:   You still have symptoms after you finish the medication.  You have an oral temperature above 102 F (38.9 C).  You develop belly (abdominal) pain.  You have pain when you urinate.  You have bleeding after sexual intercourse.  You develop a rash.  The medication makes you sick or makes you throw up (vomit). Document Released: 10/26/2000 Document Revised: 07/25/2011 Document Reviewed: 11/21/2008 [facility] Patient Information 2014 [place], [place].

## 2013-08-26 LAB — ABO/RH: ABO/RH(D): A POS

## 2013-08-26 LAB — GC/CHLAMYDIA PROBE AMP
CT PROBE, AMP APTIMA: NEGATIVE
GC Probe RNA: NEGATIVE

## 2013-08-26 LAB — POCT PREGNANCY, URINE: PREG TEST UR: POSITIVE — AB

## 2013-08-27 ENCOUNTER — Ambulatory Visit: Payer: Self-pay

## 2013-08-28 ENCOUNTER — Other Ambulatory Visit: Payer: Medicaid Other

## 2013-08-28 ENCOUNTER — Telehealth: Payer: Self-pay | Admitting: *Deleted

## 2013-08-28 NOTE — Telephone Encounter (Signed)
Called pt and left message stating that she missed an appointment in our office today. This appt is important for her medical care. Please call the office to reschedule or if she has any questions.

## 2013-08-29 ENCOUNTER — Telehealth: Payer: Self-pay

## 2013-08-29 ENCOUNTER — Other Ambulatory Visit: Payer: Medicaid Other

## 2013-08-29 DIAGNOSIS — E349 Endocrine disorder, unspecified: Secondary | ICD-10-CM

## 2013-08-29 LAB — HCG, QUANTITATIVE, PREGNANCY: HCG, BETA CHAIN, QUANT, S: 3.6 m[IU]/mL

## 2013-08-29 NOTE — Telephone Encounter (Signed)
Pt.'s beta quant level: 3.6. Consulted Dr. Erin FullingHarraway-Smith who recommended calling pt. And informing her of resolution of pregnancy. No further follow up needed at this time unless she would like birth control. Attempted to call pt. No answer. Left message stating we are calling with results please call clinic.

## 2013-08-30 ENCOUNTER — Telehealth: Payer: Self-pay | Admitting: *Deleted

## 2013-08-30 NOTE — Telephone Encounter (Signed)
Pt called nurse line and instructions given. Pt verbalizes understanding.

## 2013-08-30 NOTE — Telephone Encounter (Signed)
Pt called nurse line to obtain results.  Attempted to call patient several times, messages states incorrect procedure when calling patient.  Contacted her emergency contact to have the patient call us.

## 2014-03-17 ENCOUNTER — Encounter (HOSPITAL_COMMUNITY): Payer: Self-pay | Admitting: *Deleted
